# Patient Record
Sex: Female | Born: 2000 | Race: White | Hispanic: Yes | Marital: Single | Smoking: Never smoker
Health system: Southern US, Community
[De-identification: ages and names within clinical notes are randomized; demographics above are authoritative.]

## PROBLEM LIST (undated history)

## (undated) DIAGNOSIS — D649 Anemia, unspecified: Secondary | ICD-10-CM

## (undated) HISTORY — PX: APPENDECTOMY: SHX54

---
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
## 2021-06-14 ENCOUNTER — Emergency Department
Admission: EM | Admit: 2021-06-14 | Discharge: 2021-06-14 | Disposition: A | Discharge status: Home / Self Care | Payer: Medicaid Other | Attending: Emergency Medicine | Admitting: Emergency Medicine

## 2021-06-14 ENCOUNTER — Encounter: Payer: Self-pay | Admitting: Emergency Medicine

## 2021-06-14 ENCOUNTER — Other Ambulatory Visit: Payer: Self-pay

## 2021-06-14 DIAGNOSIS — U071 COVID-19: Secondary | ICD-10-CM | POA: Diagnosis not present

## 2021-06-14 DIAGNOSIS — J029 Acute pharyngitis, unspecified: Secondary | ICD-10-CM | POA: Diagnosis present

## 2021-06-14 LAB — RESP PANEL BY RT-PCR (FLU A&B, COVID) ARPGX2
Influenza A by PCR: NEGATIVE
Influenza B by PCR: NEGATIVE
SARS Coronavirus 2 by RT PCR: POSITIVE — AB

## 2021-06-14 MED ORDER — ACETAMINOPHEN 500 MG PO TABS
1000.0000 mg | ORAL_TABLET | Freq: Once | ORAL | Status: AC
  Administered 2021-06-14: 19:00:00 1000 mg via ORAL
  Filled 2021-06-14: qty 2

## 2021-06-14 MED ORDER — LIDOCAINE VISCOUS HCL 2 % MT SOLN: 15.0000 mL | OROMUCOSAL | 0 refills | Status: DC | PRN

## 2021-06-14 MED ORDER — ONDANSETRON 4 MG PO TBDP: 4.0000 mg | ORAL_TABLET | Freq: Three times a day (TID) | ORAL | 0 refills | Status: DC | PRN

## 2021-06-14 NOTE — Discharge Instructions (Signed)
Stay well hydrated. Take tylenol or ibuprofen for fever and pain. Follow up with primary care or return to the ER for symptoms of concern.

## 2021-06-14 NOTE — ED Provider Notes (Signed)
[facility] Emergency Department Provider Note  ____________________________________________  Time seen: Approximately 8:46 PM  I have reviewed the triage vital signs and the nursing notes.   HISTORY  Chief Complaint URI   HPI Elizabeth Lee is a 20 y.o. female presents to the ER for treatment of nasal congestion, body aches, sore throat and cough x 1 week.. She also has a poor appetite. No vomiting or diarrhea. No relief with rest.   History reviewed. No pertinent past medical history.  There are no problems to display for this patient.   History reviewed. No pertinent surgical history.  Prior to Admission medications   Medication Sig Start Date End Date Taking? Authorizing Provider  lidocaine (XYLOCAINE) 2 % solution Use as directed 15 mLs in the mouth or throat as needed for mouth pain. 06/14/21  Yes Aarush Stukey B, FNP  ondansetron (ZOFRAN-ODT) 4 MG disintegrating tablet Take 1 tablet (4 mg total) by mouth every 8 (eight) hours as needed for nausea or vomiting. 06/14/21  Yes Kemauri Musa B, FNP  cetirizine (ZYRTEC) 5 MG tablet Take 5 mg by mouth.    [provider]  ferrous sulfate 325 (65 FE) MG tablet Take 325 mg by mouth daily with breakfast.    [provider]  FLUoxetine (PROZAC) 10 MG capsule Take 10 mg by mouth daily.    [provider]  Norethindrone-Ethinyl Estradiol-Fe Biphas (LO LOESTRIN FE) 1 MG-10 MCG / 10 MCG tablet Take 1 tablet by mouth daily. 09/10/16   Gunnar Bulla, CNM  Polyethylene Glycol 3350 POWD Take by mouth. 06/06/16   [provider]    Allergies Amoxicillin and Penicillin g  Family History  Problem Relation Age of Onset   Diabetes Maternal Grandmother    Thyroid disease Maternal Grandmother     Social History Social History   Tobacco Use   Smoking status: Never   Smokeless tobacco: Never  Vaping Use   Vaping Use: Never used  Substance Use Topics   Alcohol  use: No   Drug use: No    Review of Systems Constitutional: Positive for fever/chills. Poor appetite. ENT: Positive for sore throat. Cardiovascular: Denies chest pain. Respiratory: No shortness of breath. negative for cough. Negative for wheezing.  Gastrointestinal: Positive for nausea,  no vomiting.  no diarrhea.  Musculoskeletal: Positive for  for body aches Skin: Negative for rash. Neurological: Positive for headaches ____________________________________________   PHYSICAL EXAM:  VITAL SIGNS: ED Triage Vitals  Enc Vitals Group     BP 06/14/21 1847 (!) 126/95     Pulse Rate 06/14/21 1847 98     Resp 06/14/21 1847 18     Temp 06/14/21 1847 (!) 100.7 F (38.2 C)     Temp Source 06/14/21 1847 Oral     SpO2 06/14/21 1847 99 %     Weight 06/14/21 1837 87 lb 15.4 oz (39.9 kg)     Height 06/14/21 1837 4\' 11"  (1.499 m)     Head Circumference --      Peak Flow --      Pain Score 06/14/21 1837 0     Pain Loc --      Pain Edu? --      Excl. in GC? --     Constitutional: Alert and oriented. Acutely ill appearing and in no acute distress. Eyes: Conjunctivae are normal. Ears: TM normal Nose: no sinus congestion noted; no rhinnorhea. Mouth/Throat: Mucous membranes are moist.  Oropharynx erythematous. Tonsils without exudate. Uvula midline.  Neck: No stridor.  Lymphatic: No cervical lymphadenopathy. Cardiovascular: Normal rate, regular rhythm. Good peripheral circulation. Respiratory: Respirations are even and unlabored.  No retractions. Breath sounds clear. Gastrointestinal: Soft and nontender.  Musculoskeletal: FROM x 4 extremities.  Neurologic:  Normal speech and language. Skin:  Skin is warm, dry and intact. No rash noted. Psychiatric: Mood and affect are normal. Speech and behavior are normal.  ____________________________________________   LABS (all labs ordered are listed, but only abnormal results are displayed)  Labs Reviewed  RESP PANEL BY RT-PCR (FLU A&B, COVID)  ARPGX2 - Abnormal; Notable for the following components:      Result Value   SARS Coronavirus 2 by RT PCR POSITIVE (*)    All other components within normal limits   ____________________________________________  EKG  Not indicated. ____________________________________________  RADIOLOGY  Not indicated ____________________________________________   PROCEDURES  Procedure(s) performed: None  Critical Care performed: No ____________________________________________   INITIAL IMPRESSION / ASSESSMENT AND PLAN / ED COURSE  20 y.o. female presents to the ER for evaluation of viral symptoms as described in the HPI.  Covid positive. Symptomatic treatment advised. She is to follow up with primary care or return to the ER if not improving over the week.     Medications  acetaminophen (TYLENOL) tablet 1,000 mg (1,000 mg Oral Given 06/14/21 1850)    ED Discharge Orders          Ordered    lidocaine (XYLOCAINE) 2 % solution  As needed        06/14/21 2051    ondansetron (ZOFRAN-ODT) 4 MG disintegrating tablet  Every 8 hours PRN        06/14/21 2051             Pertinent labs & imaging results that were available during my care of the patient were reviewed by me and considered in my medical decision making (see chart for details).    If controlled substance prescribed during this visit, 12 month history viewed on the [facility] prior to issuing an initial prescription for Schedule II or III opiod. ____________________________________________   FINAL CLINICAL IMPRESSION(S) / ED DIAGNOSES  Final diagnoses:  COVID    Note:  This document was prepared using Dragon voice recognition software and may include unintentional dictation errors.     Chinita Pester, FNP 06/14/21 2055    Merwyn Katos, MD 06/14/21 2118

## 2021-06-14 NOTE — ED Triage Notes (Signed)
C/O nasal congestion, body aches, sore throat, cough x 1 week.

## 2021-06-18 NOTE — L&D Delivery Note (Signed)
Delivery Note  First Stage: Labor onset: 1100 Augmentation : pitocin, AROM Analgesia /Anesthesia intrapartum: epidural AROM at 2003  Second Stage: Complete dilation at 0515 Onset of pushing at 0516 FHR second stage Category II, fetal tachycardia to the 180s, then back down to the 160s  Delivery of a viable female infant 05/06/2022 at 0740 by Donato Schultz, CNM. delivery of fetal head in OA position with restitution to LOA. No nuchal cord;  Anterior then posterior shoulders delivered easily with gentle downward traction. Baby placed on mom's chest, and attended to by peds.  Cord double clamped after cessation of pulsation, cut by FOB Cord blood sample collected   Third Stage: Placenta delivered Tomasa Blase intact with 3 VC @ [phone] Placenta disposition: discarded Uterine tone firm / bleeding scant  No laceration identified  Anesthesia for repair: n/a Repair n/a Est. Blood Loss (mL): 49ml  Complications: maternal low-grade fever in second stage with fetal tachycardia  Mom to postpartum.  Baby to Couplet care / Skin to Skin.  Newborn: Birth Weight: TBD, infant skin-to-skin  Apgar Scores: 9, 10 Feeding planned: breast and bottle

## 2021-09-19 ENCOUNTER — Emergency Department
Admission: EM | Admit: 2021-09-19 | Discharge: 2021-09-19 | Disposition: A | Discharge status: Home / Self Care | Payer: Medicaid Other | Attending: Emergency Medicine | Admitting: Emergency Medicine

## 2021-09-19 DIAGNOSIS — Z5321 Procedure and treatment not carried out due to patient leaving prior to being seen by health care provider: Secondary | ICD-10-CM | POA: Diagnosis not present

## 2021-09-19 DIAGNOSIS — O26891 Other specified pregnancy related conditions, first trimester: Secondary | ICD-10-CM | POA: Diagnosis present

## 2021-09-19 DIAGNOSIS — R1030 Lower abdominal pain, unspecified: Secondary | ICD-10-CM | POA: Insufficient documentation

## 2021-09-19 DIAGNOSIS — Z3A08 8 weeks gestation of pregnancy: Secondary | ICD-10-CM | POA: Diagnosis not present

## 2021-09-19 DIAGNOSIS — O219 Vomiting of pregnancy, unspecified: Secondary | ICD-10-CM | POA: Insufficient documentation

## 2021-09-19 LAB — COMPREHENSIVE METABOLIC PANEL
ALT: 12 U/L (ref 0–44)
AST: 19 U/L (ref 15–41)
Albumin: 4.5 g/dL (ref 3.5–5.0)
Alkaline Phosphatase: 47 U/L (ref 38–126)
Anion gap: 11 (ref 5–15)
BUN: 8 mg/dL (ref 6–20)
CO2: 23 mmol/L (ref 22–32)
Calcium: 9.2 mg/dL (ref 8.9–10.3)
Chloride: 101 mmol/L (ref 98–111)
Creatinine, Ser: 0.52 mg/dL (ref 0.44–1.00)
GFR, Estimated: >60 mL/min (ref >60)
Glucose, Bld: 101 mg/dL — ABNORMAL HIGH (ref 70–99)
Potassium: 3.8 mmol/L (ref 3.5–5.1)
Sodium: 135 mmol/L (ref 135–145)
Total Bilirubin: 0.5 mg/dL (ref 0.3–1.2)
Total Protein: 7.7 g/dL (ref 6.5–8.1)

## 2021-09-19 LAB — CBC
HCT: 34.6 % — ABNORMAL LOW (ref 36.0–46.0)
Hemoglobin: 10.9 g/dL — ABNORMAL LOW (ref 12.0–15.0)
MCH: 25.3 pg — ABNORMAL LOW (ref 26.0–34.0)
MCHC: 31.5 g/dL (ref 30.0–36.0)
MCV: 80.3 fL (ref 80.0–100.0)
Platelets: 283 10*3/uL (ref 150–400)
RBC: 4.31 MIL/uL (ref 3.87–5.11)
RDW: 14.9 % (ref 11.5–15.5)
WBC: 6.2 10*3/uL (ref 4.0–10.5)
nRBC: 0 % (ref 0.0–0.2)

## 2021-09-19 LAB — HCG, QUANTITATIVE, PREGNANCY: hCG, Beta Chain, Quant, S: 67006 m[IU]/mL — ABNORMAL HIGH (ref <5)

## 2021-09-19 LAB — LIPASE, BLOOD: Lipase: 28 U/L (ref 11–51)

## 2021-09-19 NOTE — ED Triage Notes (Signed)
Patient to ER via Pov with complaints of lower abdominal pain, describes it as cramping and none radiating. Also reports multiple episodes of emesis. Pale in appearance.  ? ?Reports being around [redacted] weeks pregnant, was seen last week by her OB. Prenatal care through [facility] health center.  ?

## 2021-10-21 LAB — OB RESULTS CONSOLE HEPATITIS B SURFACE ANTIGEN: Hepatitis B Surface Ag: NEGATIVE

## 2021-10-21 LAB — OB RESULTS CONSOLE ABO/RH

## 2021-10-21 LAB — OB RESULTS CONSOLE RUBELLA ANTIBODY, IGM: Rubella: IMMUNE

## 2021-10-21 LAB — OB RESULTS CONSOLE VARICELLA ZOSTER ANTIBODY, IGG: Varicella: IMMUNE

## 2021-12-21 ENCOUNTER — Observation Stay
Admission: EM | Admit: 2021-12-21 | Discharge: 2021-12-21 | Disposition: A | Discharge status: Home / Self Care | Payer: Medicaid Other | Attending: Obstetrics | Admitting: Obstetrics

## 2021-12-21 DIAGNOSIS — M545 Low back pain, unspecified: Secondary | ICD-10-CM | POA: Diagnosis not present

## 2021-12-21 DIAGNOSIS — R1030 Lower abdominal pain, unspecified: Secondary | ICD-10-CM | POA: Insufficient documentation

## 2021-12-21 DIAGNOSIS — O26893 Other specified pregnancy related conditions, third trimester: Principal | ICD-10-CM | POA: Insufficient documentation

## 2021-12-21 DIAGNOSIS — Z3A20 20 weeks gestation of pregnancy: Secondary | ICD-10-CM | POA: Diagnosis not present

## 2021-12-21 DIAGNOSIS — M549 Dorsalgia, unspecified: Secondary | ICD-10-CM | POA: Diagnosis present

## 2021-12-21 DIAGNOSIS — O99891 Other specified diseases and conditions complicating pregnancy: Secondary | ICD-10-CM | POA: Diagnosis present

## 2021-12-21 LAB — URINALYSIS, COMPLETE (UACMP) WITH MICROSCOPIC
Bilirubin Urine: NEGATIVE
Glucose, UA: NEGATIVE mg/dL
Hgb urine dipstick: NEGATIVE
Ketones, ur: NEGATIVE mg/dL
Leukocytes,Ua: NEGATIVE
Nitrite: NEGATIVE
Protein, ur: NEGATIVE mg/dL
Specific Gravity, Urine: 1.018 (ref 1.005–1.030)
pH: 5 (ref 5.0–8.0)

## 2021-12-21 NOTE — OB Triage Note (Signed)
Presents with complaint of lower back pain and lower abdominal cramping that started this morning around 5am. Denies bleeding or LOF. Pt states she had intercourse last night. Denies any urinary symptoms.

## 2021-12-21 NOTE — Discharge Summary (Addendum)
Elizabeth Lee is a 21 y.o. female. She is at [redacted]w[redacted]d gestation. No LMP recorded. Patient is pregnant. Estimated Date of Delivery: 05/08/22  [facility]  Chief complaint: lower abdomen and back pain  HPI: Elizabeth Lee presents to L&D with complaints of lower back and abdominal pain that started after intercourse last night  Factors complicating pregnancy: Normal pregnancy Hx of anxiety and depression  S: Resting comfortably. no CTX, no VB.no LOF,  Active fetal movement.   Maternal Medical History:  Past Medical Hx:  has no past medical history on file.    Past Surgical Hx:  has no past surgical history on file.   Allergies  Allergen Reactions   Amoxicillin Rash   Penicillin G Rash     Prior to Admission medications   Medication Sig Start Date End Date Taking? Authorizing Provider  cetirizine (ZYRTEC) 5 MG tablet Take 5 mg by mouth. Patient not taking: Reported on 12/21/2021    [provider]  ferrous sulfate 325 (65 FE) MG tablet Take 325 mg by mouth daily with breakfast.    [provider]  FLUoxetine (PROZAC) 10 MG capsule Take 10 mg by mouth daily. Patient not taking: Reported on 12/21/2021    [provider]  lidocaine (XYLOCAINE) 2 % solution Use as directed 15 mLs in the mouth or throat as needed for mouth pain. 06/14/21   Triplett, Rulon Eisenmenger B, FNP  Norethindrone-Ethinyl Estradiol-Fe Biphas (LO LOESTRIN FE) 1 MG-10 MCG / 10 MCG tablet Take 1 tablet by mouth daily. 09/10/16   Gunnar Bulla, CNM  ondansetron (ZOFRAN-ODT) 4 MG disintegrating tablet Take 1 tablet (4 mg total) by mouth every 8 (eight) hours as needed for nausea or vomiting. 06/14/21   Kem Boroughs B, FNP  Polyethylene Glycol 3350 POWD Take by mouth. 06/06/16   [provider]    Social History: She  reports that she has never smoked. She has never used smokeless tobacco. She reports that she does not drink alcohol and does not use drugs.  Family  History: family history includes Diabetes in her maternal grandmother; Thyroid disease in her maternal grandmother. ,no history of gyn cancers  Review of Systems: A full review of systems was performed and negative except as noted in the HPI.    O:  There were no vitals taken for this visit. Results for orders placed or performed during the hospital encounter of 12/21/21 (from the past 48 hour(s))  Urinalysis, Complete w Microscopic Urine, Clean Catch   Collection Time: 12/21/21  7:48 AM  Result Value Ref Range   Color, Urine YELLOW (A) YELLOW   APPearance HAZY (A) CLEAR   Specific Gravity, Urine 1.018 1.005 - 1.030   pH 5.0 5.0 - 8.0   Glucose, UA NEGATIVE NEGATIVE mg/dL   Hgb urine dipstick NEGATIVE NEGATIVE   Bilirubin Urine NEGATIVE NEGATIVE   Ketones, ur NEGATIVE NEGATIVE mg/dL   Protein, ur NEGATIVE NEGATIVE mg/dL   Nitrite NEGATIVE NEGATIVE   Leukocytes,Ua NEGATIVE NEGATIVE   RBC / HPF 0-5 0 - 5 RBC/hpf   WBC, UA 0-5 0 - 5 WBC/hpf   Bacteria, UA RARE (A) NONE SEEN   Squamous Epithelial / LPF 6-10 0 - 5   Mucus PRESENT      Constitutional: NAD, AAOx3  HE/ENT: extraocular movements grossly intact, moist mucous membranes CV: RRR PULM: nl respiratory effort Abd: gravid, non-tender, non-distended, soft  Ext: Non-tender, Nonedmeatous Psych: mood appropriate, speech normal Pelvic : deferred SVE:  Fetal Monitor: Doppler: 140 bpm  Toco: none    Assessment: 21 y.o. [redacted]w[redacted]d here for antenatal surveillance during pregnancy.  Principle diagnosis: Back pain affecting pregnancy [O99.891, M54.9]   Plan: Labor: not present.  Doppler-140 BPM Toco- Quiet UA- neg D/c home stable, precautions reviewed, follow-up as scheduled.   ----- Chari Manning CNM Certified Nurse Midwife [facility]

## 2022-02-03 LAB — OB RESULTS CONSOLE HIV ANTIBODY (ROUTINE TESTING): HIV: NONREACTIVE

## 2022-04-13 LAB — OB RESULTS CONSOLE GC/CHLAMYDIA
Chlamydia: NEGATIVE
Neisseria Gonorrhea: NEGATIVE

## 2022-04-13 LAB — OB RESULTS CONSOLE GBS: GBS: NEGATIVE

## 2022-04-28 ENCOUNTER — Encounter: Payer: Self-pay | Admitting: *Deleted

## 2022-04-28 ENCOUNTER — Observation Stay
Admission: EM | Admit: 2022-04-28 | Discharge: 2022-04-28 | Disposition: A | Discharge status: Home / Self Care | Payer: Medicaid Other | Attending: Obstetrics and Gynecology | Admitting: Obstetrics and Gynecology

## 2022-04-28 ENCOUNTER — Other Ambulatory Visit: Payer: Self-pay

## 2022-04-28 DIAGNOSIS — Z3A38 38 weeks gestation of pregnancy: Secondary | ICD-10-CM | POA: Diagnosis not present

## 2022-04-28 DIAGNOSIS — O471 False labor at or after 37 completed weeks of gestation: Principal | ICD-10-CM | POA: Insufficient documentation

## 2022-04-28 DIAGNOSIS — O479 False labor, unspecified: Secondary | ICD-10-CM | POA: Diagnosis present

## 2022-04-28 DIAGNOSIS — Z79899 Other long term (current) drug therapy: Secondary | ICD-10-CM | POA: Insufficient documentation

## 2022-04-28 HISTORY — DX: Anemia, unspecified: D64.9

## 2022-04-28 MED ORDER — CALCIUM CARBONATE ANTACID 500 MG PO CHEW: 2.0000 | CHEWABLE_TABLET | ORAL | Status: DC | PRN

## 2022-04-28 MED ORDER — ACETAMINOPHEN 500 MG PO TABS: 1000.0000 mg | ORAL_TABLET | Freq: Four times a day (QID) | ORAL | 0 refills | Status: AC | PRN

## 2022-04-28 MED ORDER — DOCUSATE SODIUM 100 MG PO CAPS: 100.0000 mg | ORAL_CAPSULE | Freq: Every day | ORAL | Status: DC

## 2022-04-28 MED ORDER — ZOLPIDEM TARTRATE 5 MG PO TABS: 5.0000 mg | ORAL_TABLET | Freq: Every evening | ORAL | Status: DC | PRN

## 2022-04-28 MED ORDER — PRENATAL MULTIVITAMIN CH: 1.0000 | ORAL_TABLET | Freq: Every day | ORAL | Status: DC

## 2022-04-28 MED ORDER — ACETAMINOPHEN 500 MG PO TABS: 1000.0000 mg | ORAL_TABLET | Freq: Four times a day (QID) | ORAL | Status: DC | PRN

## 2022-04-28 NOTE — Discharge Instructions (Signed)
Keep your next scheduled appointment.  If you have questions or concerns please call the on call provider.  If you have urgent concerns go to the nearest emergency department for evaluation.

## 2022-04-28 NOTE — OB Triage Note (Addendum)
Patient to OBS 4 with complaint of contractions that began on Friday around 6pm.  She rates her pain as 7/10 and describes the location as lower back and abdomen.  She reports baby moving well. Denies vaginal bleeding but reports increased mucus discharge. Cervix check and rechecked.  No change after 2 hours.

## 2022-04-28 NOTE — Discharge Summary (Signed)
Elizabeth Lee is a 21 y.o. female. She is at [redacted]w[redacted]d gestation. LMP 07/25/21 Estimated Date of Delivery: 05/08/22  [facility] care site[facility]  Chief complaint: uterine contractions  HPI: [name] presents to L&D with complaints of painful uterine contractions  Factors complicating pregnancy: Anemia   S: Resting comfortably. no VB.no LOF,  Active fetal movement.   Maternal Medical History:  Past Medical Hx:  has a past medical history of Anemia.    Past Surgical Hx:  has a past surgical history that includes Appendectomy.   Allergies  Allergen Reactions   Amoxicillin Rash   Penicillin G Rash     Prior to Admission medications   Medication Sig Start Date End Date Taking? Authorizing Provider  ferrous sulfate 325 (65 FE) MG tablet Take 325 mg by mouth daily with breakfast.   Yes [provider]  Prenatal Vit-Fe Fumarate-FA (PRENATAL MULTIVITAMIN) TABS tablet Take 1 tablet by mouth daily at 12 noon.   Yes [provider]  cetirizine (ZYRTEC) 5 MG tablet Take 5 mg by mouth. Patient not taking: Reported on 12/21/2021    [provider]  FLUoxetine (PROZAC) 10 MG capsule Take 10 mg by mouth daily. Patient not taking: Reported on 12/21/2021    [provider]  lidocaine (XYLOCAINE) 2 % solution Use as directed 15 mLs in the mouth or throat as needed for mouth pain. Patient not taking: Reported on 04/28/2022 06/14/21   Kem Boroughs B, FNP  Norethindrone-Ethinyl Estradiol-Fe Biphas (LO LOESTRIN FE) 1 MG-10 MCG / 10 MCG tablet Take 1 tablet by mouth daily. Patient not taking: Reported on 04/28/2022 09/10/16   Gunnar Bulla, CNM  ondansetron (ZOFRAN-ODT) 4 MG disintegrating tablet Take 1 tablet (4 mg total) by mouth every 8 (eight) hours as needed for nausea or vomiting. Patient not taking: Reported on 04/28/2022 06/14/21   Chinita Pester, FNP  Polyethylene Glycol 3350 POWD Take by mouth. Patient not taking: Reported on 04/28/2022 06/06/16    [provider]    Social History: She  reports that she has never smoked. She has never used smokeless tobacco. She reports that she does not drink alcohol and does not use drugs.  Family History: family history includes Diabetes in her maternal grandmother; Thyroid disease in her maternal grandmother. ,no history of gyn cancers  Review of Systems: A full review of systems was performed and negative except as noted in the HPI.    O:  BP 117/78   Pulse 96   Temp 100.1 F (37.8 C) (Oral)   Resp 16   SpO2 100%  No results found for this or any previous visit (from the past 48 hour(s)).   Constitutional: NAD, AAOx3  HE/ENT: extraocular movements grossly intact, moist mucous membranes CV: RRR PULM: nl respiratory effort, CTABL Abd: gravid, non-tender, non-distended, soft  Ext: Non-tender, Nonedmeatous Psych: mood appropriate, speech normal Pelvic : deferred SVE: Dilation: 1 Effacement (%): 50 Cervical Position: Posterior Station: -3 Presentation: Vertex Exam by:: J Braddy RN   Fetal Monitor: Baseline: 135 bpm Variability: moderate Accels: Present Decels: none Toco: irregular, every 1-7 minutes  Category: I   Assessment: 21 y.o. [redacted]w[redacted]d here for antenatal surveillance during pregnancy. Elizabeth Lee was evaluated for labor for 2 hours without cervical change.  Principle diagnosis:  There were no encounter diagnoses.   Plan: Labor: not present.  Fetal Wellbeing: Reassuring Cat 1 tracing. Reactive NST  D/c home stable, precautions reviewed, follow-up as scheduled.   ----- Chari Manning, CNM Certified Nurse  Midwife [facility]

## 2022-05-01 ENCOUNTER — Other Ambulatory Visit: Payer: Self-pay | Admitting: Certified Nurse Midwife

## 2022-05-01 DIAGNOSIS — Z3490 Encounter for supervision of normal pregnancy, unspecified, unspecified trimester: Secondary | ICD-10-CM

## 2022-05-01 NOTE — Progress Notes (Signed)
G1P0000 at [redacted]w[redacted]d, LMP of 07/25/2021, c/w early [place] at [redacted]w[redacted]d.  Scheduled for induction of labor for post-dates on 05/07/2022.   [facility]atal provider: [facility] Pregnancy complicated by: Anemia  Prenatal Labs: Blood type/Rh O pos  Antibody screen neg  Rubella Immune  Varicella Immune  RPR NR  HBsAg Neg  HIV NR  GC neg  Chlamydia neg  Genetic screening none  1 hour GTT 111  3 hour GTT N/a  GBS negative   Tdap: 02/16/2022 Flu: n/a Contraception: Nexplanon Feeding preference: breast  ____ Elizabeth Lee, CNM  Certified Nurse Midwife [facility]

## 2022-05-05 ENCOUNTER — Encounter: Payer: Self-pay | Admitting: Obstetrics and Gynecology

## 2022-05-05 ENCOUNTER — Inpatient Hospital Stay
Admission: EM | Admit: 2022-05-05 | Discharge: 2022-05-07 | DRG: 806 | Disposition: A | Discharge status: Home / Self Care | Payer: Medicaid Other | Attending: Certified Nurse Midwife | Admitting: Certified Nurse Midwife

## 2022-05-05 ENCOUNTER — Other Ambulatory Visit: Payer: Self-pay

## 2022-05-05 DIAGNOSIS — Z880 Allergy status to penicillin: Secondary | ICD-10-CM | POA: Diagnosis not present

## 2022-05-05 DIAGNOSIS — Z3A39 39 weeks gestation of pregnancy: Secondary | ICD-10-CM | POA: Diagnosis not present

## 2022-05-05 DIAGNOSIS — O26893 Other specified pregnancy related conditions, third trimester: Secondary | ICD-10-CM | POA: Diagnosis present

## 2022-05-05 DIAGNOSIS — O9081 Anemia of the puerperium: Secondary | ICD-10-CM | POA: Diagnosis not present

## 2022-05-05 DIAGNOSIS — O480 Post-term pregnancy: Principal | ICD-10-CM | POA: Diagnosis present

## 2022-05-05 DIAGNOSIS — D62 Acute posthemorrhagic anemia: Secondary | ICD-10-CM | POA: Diagnosis not present

## 2022-05-05 DIAGNOSIS — Z3490 Encounter for supervision of normal pregnancy, unspecified, unspecified trimester: Secondary | ICD-10-CM

## 2022-05-05 LAB — TYPE AND SCREEN
ABO/RH(D): O POS
Antibody Screen: NEGATIVE

## 2022-05-05 LAB — CBC
HCT: 35.7 % — ABNORMAL LOW (ref 36.0–46.0)
Hemoglobin: 11.9 g/dL — ABNORMAL LOW (ref 12.0–15.0)
MCH: 29 pg (ref 26.0–34.0)
MCHC: 33.3 g/dL (ref 30.0–36.0)
MCV: 87.1 fL (ref 80.0–100.0)
Platelets: 223 10*3/uL (ref 150–400)
RBC: 4.1 MIL/uL (ref 3.87–5.11)
RDW: 13.2 % (ref 11.5–15.5)
WBC: 10.3 10*3/uL (ref 4.0–10.5)
nRBC: 0 % (ref 0.0–0.2)

## 2022-05-05 LAB — ABO/RH: ABO/RH(D): O POS

## 2022-05-05 MED ORDER — LACTATED RINGERS IV SOLN: INTRAVENOUS | Status: DC

## 2022-05-05 MED ORDER — OXYTOCIN BOLUS FROM INFUSION
333.0000 mL | Freq: Once | INTRAVENOUS | Status: AC
  Administered 2022-05-06: 333 mL via INTRAVENOUS

## 2022-05-05 MED ORDER — OXYTOCIN 10 UNIT/ML IJ SOLN
INTRAMUSCULAR | Status: AC
  Filled 2022-05-05: qty 2

## 2022-05-05 MED ORDER — MISOPROSTOL 200 MCG PO TABS
ORAL_TABLET | ORAL | Status: AC
  Filled 2022-05-05: qty 4

## 2022-05-05 MED ORDER — ACETAMINOPHEN 325 MG PO TABS: 650.0000 mg | ORAL_TABLET | ORAL | Status: DC | PRN

## 2022-05-05 MED ORDER — MISOPROSTOL 25 MCG QUARTER TABLET: 25.0000 ug | ORAL_TABLET | Freq: Once | ORAL | Status: DC

## 2022-05-05 MED ORDER — LACTATED RINGERS IV SOLN: 500.0000 mL | INTRAVENOUS | Status: DC | PRN

## 2022-05-05 MED ORDER — SOD CITRATE-CITRIC ACID 500-334 MG/5ML PO SOLN: 30.0000 mL | ORAL | Status: DC | PRN

## 2022-05-05 MED ORDER — LIDOCAINE HCL (PF) 1 % IJ SOLN
INTRAMUSCULAR | Status: AC
  Filled 2022-05-05: qty 30

## 2022-05-05 MED ORDER — ONDANSETRON HCL 4 MG/2ML IJ SOLN: 4.0000 mg | Freq: Four times a day (QID) | INTRAMUSCULAR | Status: DC | PRN

## 2022-05-05 MED ORDER — LIDOCAINE HCL (PF) 1 % IJ SOLN: 30.0000 mL | INTRAMUSCULAR | Status: DC | PRN

## 2022-05-05 MED ORDER — MISOPROSTOL 25 MCG QUARTER TABLET: 25.0000 ug | ORAL_TABLET | ORAL | Status: DC | PRN

## 2022-05-05 MED ORDER — OXYTOCIN-SODIUM CHLORIDE 30-0.9 UT/500ML-% IV SOLN
1.0000 m[IU]/min | INTRAVENOUS | Status: DC
  Administered 2022-05-05 – 2022-05-06 (×2): 2 m[IU]/min via INTRAVENOUS
  Filled 2022-05-05: qty 500

## 2022-05-05 MED ORDER — AMMONIA AROMATIC IN INHA
RESPIRATORY_TRACT | Status: AC
  Filled 2022-05-05: qty 10

## 2022-05-05 MED ORDER — FENTANYL CITRATE (PF) 100 MCG/2ML IJ SOLN
50.0000 ug | INTRAMUSCULAR | Status: DC | PRN
  Administered 2022-05-05: 100 ug via INTRAVENOUS
  Filled 2022-05-05: qty 2

## 2022-05-05 MED ORDER — OXYTOCIN-SODIUM CHLORIDE 30-0.9 UT/500ML-% IV SOLN
2.5000 [IU]/h | INTRAVENOUS | Status: DC
  Administered 2022-05-06: 2.5 [IU]/h via INTRAVENOUS

## 2022-05-05 MED ORDER — TERBUTALINE SULFATE 1 MG/ML IJ SOLN: 0.2500 mg | Freq: Once | INTRAMUSCULAR | Status: DC | PRN

## 2022-05-05 NOTE — OB Triage Note (Addendum)
Pt states the ctx started around 10 or 11 today. She went for a walk and they "got worse." Denies vaginal bleeding, no LOF. Reports + fetal movement. Pt. Also rreports losing her mucous plug. Pt also reports her last OB visit was this past Thursday and she was 4 cm at that time. Elaina Hoops

## 2022-05-05 NOTE — Progress Notes (Addendum)
Labor Progress Note  Elizabeth Lee is a 21 y.o. G1P0000 at [redacted]w[redacted]d by LMP admitted for uterine contractions  Subjective: She feels pressure and has an urge to push  Objective: BP 106/62 (BP Location: Right Arm)   Pulse 75   Temp 98.4 F (36.9 C) (Oral)   Resp 15   Ht 4\' 11"  (1.499 m)   Wt 58.1 kg   BMI 25.85 kg/m  Notable VS details: reviewed  Fetal Assessment: FHT:  FHR: 125 bpm, variability: moderate,  accelerations:  Present,  decelerations:  Present intermittent variable deceleration Category/reactivity:  Category II UC:   regular, every 2-3 minutes SVE:    Dilation: 8.5cm  Effacement: 100%  Station:  0  Consistency: soft  Position: anterior  Membrane status:AROM @ 2002 Amniotic color: clear  Labs: Lab Results  Component Value Date   WBC 10.3 05/05/2022   HGB 11.9 (L) 05/05/2022   HCT 35.7 (L) 05/05/2022   MCV 87.1 05/05/2022   PLT 223 05/05/2022    Assessment / Plan: Spontaneous labor, progressing normally  Labor:  Progressing normally, pitocin currently at  1mu/min Preeclampsia:  labs stable Fetal Wellbeing:  Category II Pain Control:  Labor support without medications I/D:   GBS negative Anticipated MOD:  NSVD  3m, CNM 05/05/2022, 11:13 PM

## 2022-05-05 NOTE — Progress Notes (Signed)
Labor Progress Note  Elizabeth Lee is a 21 y.o. G1P0000 at [redacted]w[redacted]d by LMP admitted for uterine contractions  Subjective: she reports her contractions are only slightly more uncomfortable and she is having bloody show  Objective: BP 106/62 (BP Location: Right Arm)   Pulse 75   Temp 98.4 F (36.9 C) (Oral)   Resp 15   Ht 4\' 11"  (1.499 m)   Wt 58.1 kg   BMI 25.85 kg/m  Notable VS details: reviewed  Fetal Assessment: FHT:  FHR: 135 bpm, variability: moderate,  accelerations:  Present,  decelerations:  Absent Category/reactivity:  Category I UC:   regular, every 4 minutes SVE:    Dilation: 6cm  Effacement: 80%  Station:  -1  Consistency: soft  Position: middle  Membrane status:AROM @ 2002 Amniotic color: clear  Labs: Lab Results  Component Value Date   WBC 10.3 05/05/2022   HGB 11.9 (L) 05/05/2022   HCT 35.7 (L) 05/05/2022   MCV 87.1 05/05/2022   PLT 223 05/05/2022    Assessment / Plan: Spontaneous labor with slow progression  Labor:  Progressing slowly since arrival. AROM with clear fluid for augmentation. Preeclampsia:  labs stable Fetal Wellbeing:  Category I Pain Control:  Labor support without medications I/D:   GBS negative Anticipated MOD:  NSVD  05/07/2022, CNM 05/05/2022, 8:09 PM

## 2022-05-05 NOTE — H&P (Signed)
OB History & Physical   History of Present Illness:  Chief Complaint:   HPI:  Elizabeth Lee is a 21 y.o. G51P0000 female at [redacted]w[redacted]d dated by LMP.  She presents to L&D for painful uterine contractions.  Active FM onset of ctx @ 1100 currently every 4-5 minutes No LOF   Pregnancy Issues: 1. Anemia   Maternal Medical History:   Past Medical History:  Diagnosis Date   Anemia     Past Surgical History:  Procedure Laterality Date   APPENDECTOMY      Allergies  Allergen Reactions   Amoxicillin Rash   Penicillin G Rash    Prior to Admission medications   Medication Sig Start Date End Date Taking? Authorizing Provider  acetaminophen (TYLENOL) 500 MG tablet Take 2 tablets (1,000 mg total) by mouth every 6 (six) hours as needed (for pain scale < 4  OR  temperature  >/=  100.5 F). 04/28/22  Yes Sonny Dandy, CNM  ferrous sulfate 325 (65 FE) MG tablet Take 325 mg by mouth daily with breakfast.   Yes [provider]  Prenatal Vit-Fe Fumarate-FA (PRENATAL MULTIVITAMIN) TABS tablet Take 1 tablet by mouth daily at 12 noon.   Yes [provider]     [facility]ren[facility] care site: [facility]  Social History: She  reports that she has never smoked. She has never used smokeless tobacco. She reports that she does not drink alcohol and does not use drugs.  Family History: family history includes Diabetes in her maternal grandmother; Thyroid disease in her maternal grandmother.   Review of Systems: A full review of systems was performed and negative except as noted in the HPI.     Physical Exam:  Vital Signs: BP 122/71 (BP Location: Right Arm)   Pulse 89   Temp 98.2 F (36.8 C) (Oral)   Resp 16   Ht 4\' 11"  (1.499 m)   Wt 58.1 kg   BMI 25.85 kg/m  General: no acute distress.  HEENT: normocephalic, atraumatic Heart: regular rate & rhythm.  No murmurs/rubs/gallops Lungs: clear to auscultation bilaterally, normal respiratory effort Abdomen: soft, gravid,  non-tender;  EFW: 6.5lb Pelvic:   External: Normal external female genitalia  Cervix: Dilation: 5 / Effacement (%): 80 / Station: -1    Extremities: non-tender, symmetric, no edema bilaterally.  DTRs: +2  Neurologic: Alert & oriented x 3.    No results found for this or any previous visit (from the past 24 hour(s)).  Pertinent Results:  Prenatal Labs: Blood type/Rh O pos  Antibody screen neg  Rubella Immune  Varicella Immune  RPR NR  HBsAg Neg  HIV NR  GC neg  Chlamydia neg  Genetic screening negative  1 hour GTT 111  3 hour GTT N/a  GBS Negative   FHT: 130bpm, moderate variability, accelerations present, no decelerations TOCO: contractions q3-9min, moderate palpation SVE:  Dilation: 5 / Effacement (%): 80 / Station: -1    Cephalic by leopolds  No results found.  Assessment:  Elizabeth Lee is a 21 y.o. G78P0000 female at [redacted]w[redacted]d with uterine contractions.   Plan:  1. Admit to Labor & Delivery; consents reviewed and obtained - Dr. [redacted]w[redacted]d notified  2. Fetal Well being  - Fetal Tracing: Category I tracing - Group B Streptococcus ppx indicated: n/a, GBS negative - Presentation: vertex confirmed by SVE   3. Routine OB: - Prenatal labs reviewed, as above - Rh pos - CBC, T&S, RPR on admit - Clear fluids, saline lock  4. Monitoring of Labor -  Contractions q3-53min, external toco in place -  Plan for continuous fetal monitoring  -  Maternal pain control as desired; requesting IVPM - Anticipate vaginal delivery  5. Post Partum Planning: - Infant feeding: breast and bottle feeding - Contraception: Nexplanon - Tdap: 02/16/2022 - Flu: declined  Janyce Llanos, CNM 05/05/22 3:20 PM

## 2022-05-06 ENCOUNTER — Inpatient Hospital Stay: Payer: Medicaid Other | Admitting: Anesthesiology

## 2022-05-06 ENCOUNTER — Encounter: Payer: Self-pay | Admitting: Obstetrics and Gynecology

## 2022-05-06 LAB — RPR: RPR Ser Ql: NONREACTIVE

## 2022-05-06 MED ORDER — ONDANSETRON HCL 4 MG/2ML IJ SOLN: 4.0000 mg | INTRAMUSCULAR | Status: DC | PRN

## 2022-05-06 MED ORDER — IBUPROFEN 600 MG PO TABS
600.0000 mg | ORAL_TABLET | Freq: Four times a day (QID) | ORAL | Status: DC
  Administered 2022-05-06 – 2022-05-07 (×3): 600 mg via ORAL
  Filled 2022-05-06 (×4): qty 1

## 2022-05-06 MED ORDER — SENNOSIDES-DOCUSATE SODIUM 8.6-50 MG PO TABS
2.0000 | ORAL_TABLET | Freq: Every day | ORAL | Status: DC
  Administered 2022-05-07: 2 via ORAL
  Filled 2022-05-06: qty 2

## 2022-05-06 MED ORDER — EPHEDRINE 5 MG/ML INJ: 10.0000 mg | INTRAVENOUS | Status: DC | PRN

## 2022-05-06 MED ORDER — FENTANYL-BUPIVACAINE-NACL 0.5-0.125-0.9 MG/250ML-% EP SOLN
12.0000 mL/h | EPIDURAL | Status: DC | PRN
  Administered 2022-05-06: 12 mL/h via EPIDURAL

## 2022-05-06 MED ORDER — LIDOCAINE HCL (PF) 1 % IJ SOLN
INTRAMUSCULAR | Status: DC | PRN
  Administered 2022-05-06: 3 mL via SUBCUTANEOUS

## 2022-05-06 MED ORDER — PRENATAL MULTIVITAMIN CH
1.0000 | ORAL_TABLET | Freq: Every day | ORAL | Status: DC
  Administered 2022-05-06 – 2022-05-07 (×2): 1 via ORAL
  Filled 2022-05-06 (×2): qty 1

## 2022-05-06 MED ORDER — ACETAMINOPHEN 500 MG PO TABS
ORAL_TABLET | ORAL | Status: AC
  Filled 2022-05-06: qty 2

## 2022-05-06 MED ORDER — LIDOCAINE-EPINEPHRINE (PF) 1.5 %-1:200000 IJ SOLN
INTRAMUSCULAR | Status: DC | PRN
  Administered 2022-05-06: 4 mL via EPIDURAL

## 2022-05-06 MED ORDER — WITCH HAZEL-GLYCERIN EX PADS
1.0000 | MEDICATED_PAD | CUTANEOUS | Status: DC | PRN
  Administered 2022-05-06 (×2): 1 via TOPICAL
  Filled 2022-05-06 (×2): qty 100

## 2022-05-06 MED ORDER — PHENYLEPHRINE 80 MCG/ML (10ML) SYRINGE FOR IV PUSH (FOR BLOOD PRESSURE SUPPORT): 80.0000 ug | PREFILLED_SYRINGE | INTRAVENOUS | Status: DC | PRN

## 2022-05-06 MED ORDER — DIPHENHYDRAMINE HCL 25 MG PO CAPS: 25.0000 mg | ORAL_CAPSULE | Freq: Four times a day (QID) | ORAL | Status: DC | PRN

## 2022-05-06 MED ORDER — DIBUCAINE (PERIANAL) 1 % EX OINT
1.0000 | TOPICAL_OINTMENT | CUTANEOUS | Status: DC | PRN
  Administered 2022-05-06 (×2): 1 via RECTAL
  Filled 2022-05-06 (×2): qty 28

## 2022-05-06 MED ORDER — ACETAMINOPHEN 325 MG PO TABS
650.0000 mg | ORAL_TABLET | ORAL | Status: DC | PRN
  Administered 2022-05-06: 650 mg via ORAL
  Filled 2022-05-06 (×2): qty 2

## 2022-05-06 MED ORDER — SODIUM CHLORIDE 0.9 % IV SOLN
INTRAVENOUS | Status: DC | PRN
  Administered 2022-05-06: 5 mL via EPIDURAL
  Administered 2022-05-06: 3 mL via EPIDURAL

## 2022-05-06 MED ORDER — SIMETHICONE 80 MG PO CHEW: 80.0000 mg | CHEWABLE_TABLET | ORAL | Status: DC | PRN

## 2022-05-06 MED ORDER — BENZOCAINE-MENTHOL 20-0.5 % EX AERO
1.0000 | INHALATION_SPRAY | CUTANEOUS | Status: DC | PRN
  Administered 2022-05-06 (×2): 1 via TOPICAL
  Filled 2022-05-06 (×2): qty 56

## 2022-05-06 MED ORDER — ONDANSETRON HCL 4 MG PO TABS: 4.0000 mg | ORAL_TABLET | ORAL | Status: DC | PRN

## 2022-05-06 MED ORDER — DIPHENHYDRAMINE HCL 50 MG/ML IJ SOLN: 12.5000 mg | INTRAMUSCULAR | Status: DC | PRN

## 2022-05-06 MED ORDER — COCONUT OIL OIL: 1.0000 | TOPICAL_OIL | Status: DC | PRN

## 2022-05-06 MED ORDER — PHENYLEPHRINE 80 MCG/ML (10ML) SYRINGE FOR IV PUSH (FOR BLOOD PRESSURE SUPPORT)
80.0000 ug | PREFILLED_SYRINGE | INTRAVENOUS | Status: DC | PRN
  Administered 2022-05-06 (×2): 160 ug via INTRAVENOUS

## 2022-05-06 MED ORDER — ACETAMINOPHEN 500 MG PO TABS
1000.0000 mg | ORAL_TABLET | Freq: Four times a day (QID) | ORAL | Status: DC | PRN
  Administered 2022-05-06: 1000 mg via ORAL

## 2022-05-06 MED ORDER — ZOLPIDEM TARTRATE 5 MG PO TABS: 5.0000 mg | ORAL_TABLET | Freq: Every evening | ORAL | Status: DC | PRN

## 2022-05-06 MED ORDER — LACTATED RINGERS IV SOLN: 500.0000 mL | Freq: Once | INTRAVENOUS | Status: DC

## 2022-05-06 MED ORDER — FENTANYL-BUPIVACAINE-NACL 0.5-0.125-0.9 MG/250ML-% EP SOLN
EPIDURAL | Status: AC
  Filled 2022-05-06: qty 250

## 2022-05-06 NOTE — Lactation Note (Signed)
This note was copied from a baby's chart. Lactation Consultation Note  Patient Name: Elizabeth Lee [id number] Date: 05/06/2022 Reason for consult: Initial assessment,primapara  Age:20 hours  Maternal Data This is mom's first baby, with an SVD. Mom has an history of anemia.  Today was an initial assessment, which covered basic breastfeeding education with mom. Per mom, she primarily wants to breastfeed, but will use formula supplementation if she feels necessary. Mom reports that latching has been going well and she doesn't have any signs of discomfort. Mom reports that her breasts has been leaking as well.  Has patient been taught Hand Expression?: Yes Does the patient have breastfeeding experience prior to this delivery?: No  Feeding Mother's Current Feeding Choice: Breast Milk and Formula   Interventions Interventions: Breast feeding basics reviewed;Education  Reviewed with mom the wet/voids per day based upon infants age, feeding cues, hand expression, and cluster feeding around the 24 hour mark.  Consult Status Consult Status: Follow-up Date: 05/07/22 Follow-up type: In-patient  Updated care nurse following visit with patient.  [name] 05/06/2022, 3:04 PM

## 2022-05-06 NOTE — Discharge Summary (Signed)
Obstetrical Discharge Summary  Patient Name: Elizabeth Lee DOB: [birth year] MRN: [id number]  Date of Admission: 05/05/2022 Date of Delivery: 05/06/22 Delivered by: Elizabeth Lee, CNM  Date of Discharge: 05/07/2022  Primary OB: Elizabeth Lee LMP:No LMP recorded. EDC Estimated Date of Delivery: 05/08/22 Gestational Age at Delivery: [redacted]w[redacted]d   Antepartum complications:  Anemia  Admitting Diagnosis: Uterine contractions Secondary Diagnosis: NSVD Patient Active Problem List   Diagnosis Date Noted   NSVD (normal spontaneous vaginal delivery) 05/07/2022   Acute blood loss anemia 05/07/2022   Uterine contractions 04/28/2022   Back pain affecting pregnancy 12/21/2021   Back pain affecting pregnancy in second trimester 12/21/2021    Discharge Diagnosis: Term Pregnancy Delivered      Augmentation: AROM and Pitocin Complications: None Intrapartum complications/course: She arrived with painful contractions and was 4/60/-1. She slowly progressed and was augmented with AROM and pitocin. She reached 10/100/+2 and pushed for 2 hours, delivering viable female infant over intact perineum. Apgars 9, 10. Maternal low-grade fever (maximum temperature 100.15F) and fetal tachycardia, no maternal tachycardia. Chorio not present, no need for antibiotics.  Delivery Type: spontaneous vaginal delivery Anesthesia: epidural anesthesia Placenta: spontaneous To Pathology: No  Laceration: none Episiotomy: none Newborn Data: Live born female "[place]" Birth Weight:  3320g (7lb5.1oz) APGAR: 9, 10  Newborn Delivery   Birth date/time: 05/06/2022 07:40:00 Delivery type: Vaginal, Spontaneous      Postpartum Procedures: none Edinburgh:     05/06/2022    7:55 PM  Edinburgh Postnatal Depression Scale Screening Tool  I have been able to laugh and see the funny side of things. 0  I have looked forward with enjoyment to things. 0  I have blamed myself unnecessarily when things went wrong. 0  I have been  anxious or worried for no good reason. 2  I have felt scared or panicky for no good reason. 0  Things have been getting on top of me. 1  I have been so unhappy that I have had difficulty sleeping. 0  I have felt sad or miserable. 1  I have been so unhappy that I have been crying. 0  The thought of harming myself has occurred to me. 0  Edinburgh Postnatal Depression Scale Total 4     Post partum course:  Patient had an uncomplicated postpartum course.  By time of discharge on PPD#1, her pain was controlled on oral pain medications; she had appropriate lochia and was ambulating, voiding without difficulty and tolerating regular diet.  She was deemed stable for discharge to home.     Discharge Physical Exam:  BP 111/76 (BP Location: Left Arm)   Pulse 71   Temp 98.3 F (36.8 C) (Oral)   Resp 20   Ht 4\' 11"  (1.499 m)   Wt 58.1 kg   SpO2 98%   Breastfeeding Unknown   BMI 25.85 kg/m   General: NAD CV: RRR Pulm: nl effort ABD: s/nd/nt, fundus firm and below the umbilicus Lochia: moderate Perineum: minimal edema/intact DVT Evaluation: LE non-ttp, no evidence of DVT on exam.  Hemoglobin  Date Value Ref Range Status  05/07/2022 9.1 (L) 12.0 - 15.0 g/dL Final   HCT  Date Value Ref Range Status  05/07/2022 27.5 (L) 36.0 - 46.0 % Final    Risk assessment for postpartum VTE and prophylactic treatment: Very high risk factors: None High risk factors: None Moderate risk factors: None  Postpartum VTE prophylaxis with LMWH not indicated  Disposition: stable, discharge to home. Baby Feeding: breast and formula feeding Baby  Disposition: home with mom  Rh Immune globulin indicated: No Rubella vaccine given: was not indicated Varivax vaccine given: was not indicated Flu vaccine given in AP setting: No Tdap vaccine given in AP setting: Yes   Contraception:  Nexplanon  Prenatal Labs:  Blood type/Rh O pos  Antibody screen neg  Rubella Immune  Varicella Immune  RPR NR  HBsAg  Neg  HIV NR  GC neg  Chlamydia neg  Genetic screening negative  1 hour GTT 111  3 hour GTT N/a  GBS Negative    Plan:  Elizabeth Lee was discharged to home in good condition.   Discharge Medications: Allergies as of 05/07/2022       Reactions   Amoxicillin Rash   Penicillin G Rash        Medication List     TAKE these medications    acetaminophen 500 MG tablet Commonly known as: TYLENOL Take 2 tablets (1,000 mg total) by mouth every 6 (six) hours as needed (for pain scale < 4  OR  temperature  >/=  100.5 F).   ferrous sulfate 325 (65 FE) MG tablet Take 325 mg by mouth daily with breakfast.   ibuprofen 600 MG tablet Commonly known as: ADVIL Take 1 tablet (600 mg total) by mouth every 6 (six) hours as needed.   prenatal multivitamin Tabs tablet Take 1 tablet by mouth daily at 12 noon.         Follow-up Information     Elizabeth Lee, CNM Follow up in 6 week(s).   Specialty: Certified Nurse Midwife Why: 6wk postpartum, desires Nexplanon Contact information: [address] [place] [address] [phone]                 Signed: Cyril Lee, CNM 05/07/2022

## 2022-05-06 NOTE — Anesthesia Preprocedure Evaluation (Signed)
Anesthesia Evaluation  Patient identified by MRN, date of birth, ID band Patient awake    Reviewed: Allergy & Precautions, H&P , NPO status , Patient's Chart, lab work & pertinent test results, reviewed documented beta blocker date and time   Airway Mallampati: II  TM Distance: >3 FB Neck ROM: full    Dental no notable dental hx. (+) Teeth Intact   Pulmonary neg pulmonary ROS   Pulmonary exam normal breath sounds clear to auscultation       Cardiovascular Exercise Tolerance: Good negative cardio ROS  Rhythm:regular Rate:Normal     Neuro/Psych negative neurological ROS  negative psych ROS   GI/Hepatic negative GI ROS, Neg liver ROS,,,  Endo/Other  negative endocrine ROSdiabetes, Gestational    Renal/GU negative Renal ROS  negative genitourinary   Musculoskeletal   Abdominal   Peds  Hematology  (+) Blood dyscrasia, anemia   Anesthesia Other Findings   Reproductive/Obstetrics (+) Pregnancy                             Anesthesia Physical Anesthesia Plan  ASA: 2  Anesthesia Plan: Epidural   Post-op Pain Management:    Induction:   PONV Risk Score and Plan:   Airway Management Planned:   Additional Equipment:   Intra-op Plan:   Post-operative Plan:   Informed Consent: I have reviewed the patients History and Physical, chart, labs and discussed the procedure including the risks, benefits and alternatives for the proposed anesthesia with the patient or authorized representative who has indicated his/her understanding and acceptance.       Plan Discussed with:   Anesthesia Plan Comments:        Anesthesia Quick Evaluation

## 2022-05-06 NOTE — Anesthesia Procedure Notes (Signed)
Epidural Patient location during procedure: OB End time: 05/06/2022 12:33 AM  Staffing Anesthesiologist: Yevette Edwards, MD Performed: anesthesiologist   Preanesthetic Checklist Completed: patient identified, IV checked, site marked, risks and benefits discussed, surgical consent, monitors and equipment checked, pre-op evaluation and timeout performed  Epidural Patient position: sitting Prep: Betadine Patient monitoring: heart rate, continuous pulse ox and blood pressure Approach: midline Location: L4-L5 Injection technique: LOR saline  Needle:  Needle type: Tuohy  Needle gauge: 17 G Needle length: 9 cm and 9 Needle insertion depth: 5 cm Catheter type: closed end flexible Catheter size: 19 Gauge Catheter at skin depth: 11 cm Test dose: negative and 1.5% lidocaine with Epi 1:200 K  Assessment Events: blood not aspirated, injection not painful, no injection resistance, no paresthesia and negative IV test  Additional Notes   Patient tolerated the insertion well without complications.Reason for block:procedure for pain

## 2022-05-06 NOTE — Progress Notes (Addendum)
Labor Progress Note  Elizabeth Lee is a 21 y.o. G1P0000 at [redacted]w[redacted]d by LMP admitted for uterine contractions  Subjective: she is comfortable after her epidural and now feels rectal pressure  Objective: BP (!) 112/56 (BP Location: Left Arm)   Pulse 90   Temp 99.3 F (37.4 C) (Oral)   Resp 14   Ht 4\' 11"  (1.499 m)   Wt 58.1 kg   SpO2 97%   BMI 25.85 kg/m  Notable VS details: reviewed  Fetal Assessment: FHT:  FHR: 135 bpm, variability: moderate,  accelerations:  Present,  decelerations:  Present recurrent early decelerations Category/reactivity:  Category I UC:   regular, every 2-3 minutes SVE:    Dilation: 10cm  Effacement: 100%  Station:  +2  Consistency: soft  Position: anterior  Membrane status:AROM @ 2002 Amniotic color: clear  Labs: Lab Results  Component Value Date   WBC 10.3 05/05/2022   HGB 11.9 (L) 05/05/2022   HCT 35.7 (L) 05/05/2022   MCV 87.1 05/05/2022   PLT 223 05/05/2022    Assessment / Plan: Spontaneous labor, progressing normally  Labor:  Progressing normally, pitocin currently at 55mu/min. Feeling pressure and 10/100/+2, begin pushing. Preeclampsia:  labs stable Fetal Wellbeing:  Category I Pain Control:  Epidural I/D:   GBS negative, AROM x 9 hours Anticipated MOD:  NSVD  11m, CNM 05/06/2022, 5:31 AM

## 2022-05-06 NOTE — Anesthesia Postprocedure Evaluation (Signed)
Anesthesia Post Note  Patient: Elizabeth Lee  Procedure(s) Performed: AN AD HOC LABOR EPIDURAL  Patient location during evaluation: Mother Baby Anesthesia Type: Epidural Level of consciousness: awake and alert Pain management: pain level controlled Vital Signs Assessment: post-procedure vital signs reviewed and stable Respiratory status: spontaneous breathing, nonlabored ventilation and respiratory function stable Cardiovascular status: stable Postop Assessment: no headache, no backache and epidural receding Anesthetic complications: no   No notable events documented.   Last Vitals:  Vitals:   05/06/22 0937 05/06/22 1005  BP: 137/75 127/76  Pulse: 60 64  Resp:    Temp:    SpO2:      Last Pain:  Vitals:   05/06/22 0937  TempSrc:   PainSc: 2                  Yevette Edwards

## 2022-05-07 DIAGNOSIS — D62 Acute posthemorrhagic anemia: Secondary | ICD-10-CM | POA: Diagnosis not present

## 2022-05-07 LAB — CBC
HCT: 27.5 % — ABNORMAL LOW (ref 36.0–46.0)
Hemoglobin: 9.1 g/dL — ABNORMAL LOW (ref 12.0–15.0)
MCH: 29.1 pg (ref 26.0–34.0)
MCHC: 33.1 g/dL (ref 30.0–36.0)
MCV: 87.9 fL (ref 80.0–100.0)
Platelets: 185 10*3/uL (ref 150–400)
RBC: 3.13 MIL/uL — ABNORMAL LOW (ref 3.87–5.11)
RDW: 13.4 % (ref 11.5–15.5)
WBC: 13.7 10*3/uL — ABNORMAL HIGH (ref 4.0–10.5)
nRBC: 0.1 % (ref 0.0–0.2)

## 2022-05-07 MED ORDER — IBUPROFEN 600 MG PO TABS: 600.0000 mg | ORAL_TABLET | Freq: Four times a day (QID) | ORAL | Status: AC | PRN

## 2022-05-07 NOTE — Discharge Summary (Signed)
RN to remove peripheral IV. Went over discharge instructions with patient. Gave patient opportunity for questions. All questions answered at this time. Pt verbalized understanding of teaching. Pt discharged home with her husband and baby via wheelchair

## 2022-05-07 NOTE — Progress Notes (Signed)
Post Partum Day 1  Subjective: Doing well, no concerns. Ambulating without difficulty, pain managed with PO meds, tolerating regular diet, and voiding without difficulty.   No fever/chills, chest pain, shortness of breath, nausea/vomiting, or leg pain. No nipple or breast pain. No headache, visual changes, or RUQ/epigastric pain.  Objective: BP (!) 97/55 (BP Location: Left Arm)   Pulse 86   Temp 98.6 F (37 C) (Oral)   Resp 18   Ht 4\' 11"  (1.499 m)   Wt 58.1 kg   SpO2 100%   Breastfeeding Unknown   BMI 25.85 kg/m    Physical Exam:  General: alert and cooperative Breasts: soft/nontender CV: RRR Pulm: nl effort Abdomen: soft, non-tender Uterine Fundus: firm Incision: n/a Perineum: minimal edema, intact Lochia: appropriate DVT Evaluation: No evidence of DVT seen on physical exam. Edinburgh:     05/06/2022    7:55 PM  05/08/2022 Postnatal Depression Scale Screening Tool  I have been able to laugh and see the funny side of things. 0  I have looked forward with enjoyment to things. 0  I have blamed myself unnecessarily when things went wrong. 0  I have been anxious or worried for no good reason. 2  I have felt scared or panicky for no good reason. 0  Things have been getting on top of me. 1  I have been so unhappy that I have had difficulty sleeping. 0  I have felt sad or miserable. 1  I have been so unhappy that I have been crying. 0  The thought of harming myself has occurred to me. 0  Edinburgh Postnatal Depression Scale Total 4     Recent Labs    05/05/22 1555 05/07/22 0618  HGB 11.9* 9.1*  HCT 35.7* 27.5*  WBC 10.3 13.7*  PLT 223 185    Assessment/Plan: 21 y.o. G1P1001 postpartum day # 1  -Continue routine postpartum care -Lactation consult PRN for breastfeeding   -Acute blood loss anemia - hemodynamically stable and asymptomatic; start PO ferrous sulfate BID with stool softeners  -Immunization status: Needs flu prior to discharge - though she declined  in the AP setting  Disposition: Continue inpatient postpartum care   LOS: 2 days   Francisca Harbuck, CNM 05/07/2022, 8:25 AM

## 2022-12-16 ENCOUNTER — Other Ambulatory Visit: Payer: Self-pay

## 2022-12-16 ENCOUNTER — Emergency Department
Admission: EM | Admit: 2022-12-16 | Discharge: 2022-12-16 | Disposition: A | Discharge status: Home / Self Care | Payer: Medicaid Other | Attending: Emergency Medicine | Admitting: Emergency Medicine

## 2022-12-16 ENCOUNTER — Encounter: Payer: Self-pay | Admitting: Emergency Medicine

## 2022-12-16 DIAGNOSIS — R21 Rash and other nonspecific skin eruption: Secondary | ICD-10-CM | POA: Diagnosis present

## 2022-12-16 MED ORDER — HYDROCORTISONE 1 % EX CREA: 1.0000 | TOPICAL_CREAM | Freq: Four times a day (QID) | CUTANEOUS | 0 refills | Status: AC

## 2022-12-16 MED ORDER — DIPHENHYDRAMINE HCL 25 MG PO CAPS
25.0000 mg | ORAL_CAPSULE | Freq: Once | ORAL | Status: AC
  Administered 2022-12-16: 25 mg via ORAL
  Filled 2022-12-16: qty 1

## 2022-12-16 MED ORDER — DIPHENHYDRAMINE HCL 50 MG PO TABS: 50.0000 mg | ORAL_TABLET | Freq: Every evening | ORAL | 0 refills | Status: AC | PRN

## 2022-12-16 NOTE — ED Notes (Signed)
Patient states she has someone coming to pick her up.

## 2022-12-16 NOTE — ED Provider Notes (Signed)
[facility] Emergency Department Provider Note     Event Date/Time   First MD Initiated Contact with Patient 12/16/22 1121     (approximate)   History   Rash   HPI  Elizabeth Lee is a 22 y.o. female who presents to the ED for complaint of a rash x 2 days.  Patient reports she thinks something bit her on her left thigh while being at home.  Patient reports area is painful to touch.  Patient reports generalized itching all over.  Patient denies taking anything for symptoms. No other complaints at this time.  Denies fever, chest pain, and shortness of breath.    Physical Exam   Triage Vital Signs: ED Triage Vitals  Enc Vitals Group     BP 12/16/22 1116 121/84     Pulse Rate 12/16/22 1116 88     Resp 12/16/22 1116 14     Temp 12/16/22 1116 99.4 F (37.4 C)     Temp Source 12/16/22 1116 Axillary     SpO2 12/16/22 1116 100 %     Weight 12/16/22 1118 102 lb (46.3 kg)     Height 12/16/22 1118 4\' 9"  (1.448 m)     Head Circumference --      Peak Flow --      Pain Score 12/16/22 1117 8     Pain Loc --      Pain Edu? --      Excl. in GC? --     Most recent vital signs: Vitals:   12/16/22 1116  BP: 121/84  Pulse: 88  Resp: 14  Temp: 99.4 F (37.4 C)  SpO2: 100%    General Awake, no distress.  HEENT NCAT. PERRL. EOMI. No rhinorrhea. Mucous membranes are moist.  CV:  Good peripheral perfusion.  RESP:  Normal effort.  ABD:  No distention.  Other:  Left lateral thigh reveals small, round, erythremic, mildly raised papule with a single pinpoint scab centrally. No surrounding edema noted. TTP.   ED Results / Procedures / Treatments   Labs (all labs ordered are listed, but only abnormal results are displayed) Labs Reviewed - No data to display  No results found.  PROCEDURES:  Critical Care performed: No  Procedures  MEDICATIONS ORDERED IN ED: Medications  diphenhydrAMINE (BENADRYL) capsule 25 mg (25 mg Oral Given 12/16/22 1158)    IMPRESSION / MDM / ASSESSMENT AND PLAN / ED COURSE  I reviewed the triage vital signs and the nursing notes.                               22 y.o. female presents to the emergency department for evaluation and treatment of a rash. See HPI for further details.   Differential diagnosis includes, but is not limited to insect bite, contact dermatitis, allergic dermatitis, tick bite, allergic reaction, urticaria.  On physical exam there is a small red inflamed bump on the left lateral mid thigh.  Patient reports it sometimes painful sometimes itchy.  Unknown source.  Highly suspicious for insect bite possible spider bite given the appearance.  No further workup indicated at this time. She will be administered Benadryl prior to discharge.  We discussed sedative side effects of this medication.  She will be picked up by her boyfriend from the ED.  Patient will be discharged home with prescriptions for Benadryl and hydrocortisone cream for symptom relief.  Patient is educated on at  home management including applying cool compresses, avoiding scratching or rubbing the area and exposing the area to air.  She is in stable condition for discharge home. Patient is given ED precautions to return to the ED for any worsening or new symptoms. Patient verbalizes understanding. All questions and concerns were addressed during ED visit.    Patient's presentation is most consistent with acute, uncomplicated illness.  FINAL CLINICAL IMPRESSION(S) / ED DIAGNOSES   Final diagnoses:  Rash     Rx / DC Orders   ED Discharge Orders          Ordered    diphenhydrAMINE (BENADRYL) 50 MG tablet  At bedtime PRN        12/16/22 1149    hydrocortisone cream 1 %  4 times daily        12/16/22 1149             Note:  This document was prepared using Dragon voice recognition software and may include unintentional dictation errors.    Romeo Apple, Ariyan Sinnett A, PA-C 12/16/22 1752    Trinna Post, MD 12/16/22 [phone]

## 2022-12-16 NOTE — ED Triage Notes (Signed)
Pt states that 2 days ago she was bit on her left leg by an unknown insect and since then she has developed itching and a rash. Pt denies taking any medications since. Pt states pain increases when walking. Pt states she has also been feeding a neighbors cat and thinks that could be a cause of her itching as well.

## 2022-12-16 NOTE — Discharge Instructions (Addendum)
Pick up calamine lotion from local pharmacy or nearest [facility], [employer] etc.   Apply cool, wet cloths (compresses) to the affected areas. Do not scratch or rub your skin. Avoid covering the rash. Keep it exposed to air as often as you can.

## 2023-06-21 ENCOUNTER — Emergency Department: Payer: Medicaid Other

## 2023-06-21 ENCOUNTER — Other Ambulatory Visit: Payer: Self-pay

## 2023-06-21 ENCOUNTER — Emergency Department
Admission: EM | Admit: 2023-06-21 | Discharge: 2023-06-21 | Disposition: A | Discharge status: Home / Self Care | Payer: Medicaid Other | Attending: Emergency Medicine | Admitting: Emergency Medicine

## 2023-06-21 DIAGNOSIS — R1013 Epigastric pain: Secondary | ICD-10-CM | POA: Diagnosis present

## 2023-06-21 DIAGNOSIS — K2900 Acute gastritis without bleeding: Secondary | ICD-10-CM | POA: Diagnosis not present

## 2023-06-21 LAB — COMPREHENSIVE METABOLIC PANEL
ALT: 11 U/L (ref 0–44)
AST: 19 U/L (ref 15–41)
Albumin: 4.4 g/dL (ref 3.5–5.0)
Alkaline Phosphatase: 57 U/L (ref 38–126)
Anion gap: 13 (ref 5–15)
BUN: 17 mg/dL (ref 6–20)
CO2: 20 mmol/L — ABNORMAL LOW (ref 22–32)
Calcium: 8.7 mg/dL — ABNORMAL LOW (ref 8.9–10.3)
Chloride: 102 mmol/L (ref 98–111)
Creatinine, Ser: 0.62 mg/dL (ref 0.44–1.00)
GFR, Estimated: >60 mL/min (ref >60)
Glucose, Bld: 111 mg/dL — ABNORMAL HIGH (ref 70–99)
Potassium: 3.5 mmol/L (ref 3.5–5.1)
Sodium: 135 mmol/L (ref 135–145)
Total Bilirubin: 0.7 mg/dL (ref 0.0–1.2)
Total Protein: 7.2 g/dL (ref 6.5–8.1)

## 2023-06-21 LAB — URINALYSIS, ROUTINE W REFLEX MICROSCOPIC
Bacteria, UA: NONE SEEN
Bilirubin Urine: NEGATIVE
Glucose, UA: NEGATIVE mg/dL
Hgb urine dipstick: NEGATIVE
Ketones, ur: NEGATIVE mg/dL
Nitrite: NEGATIVE
Protein, ur: 30 mg/dL — AB
Specific Gravity, Urine: 1.027 (ref 1.005–1.030)
pH: 8 (ref 5.0–8.0)

## 2023-06-21 LAB — CBC
HCT: 37.2 % (ref 36.0–46.0)
Hemoglobin: 11.8 g/dL — ABNORMAL LOW (ref 12.0–15.0)
MCH: 28.4 pg (ref 26.0–34.0)
MCHC: 31.7 g/dL (ref 30.0–36.0)
MCV: 89.4 fL (ref 80.0–100.0)
Platelets: 264 10*3/uL (ref 150–400)
RBC: 4.16 MIL/uL (ref 3.87–5.11)
RDW: 13.5 % (ref 11.5–15.5)
WBC: 7.5 10*3/uL (ref 4.0–10.5)
nRBC: 0 % (ref 0.0–0.2)

## 2023-06-21 LAB — LIPASE, BLOOD: Lipase: 24 U/L (ref 11–51)

## 2023-06-21 LAB — POC URINE PREG, ED: Preg Test, Ur: NEGATIVE

## 2023-06-21 MED ORDER — FAMOTIDINE 20 MG PO TABS: 20.0000 mg | ORAL_TABLET | Freq: Every day | ORAL | 1 refills | Status: AC

## 2023-06-21 NOTE — ED Triage Notes (Signed)
 Intermittent lower abd pain and L flank pain x 3 days. Reports when pain comes on she gets shaky and hurts so bad she can't walk. Denies painful urination but reports increased frequency. Pt ambulatory to triage. Alert and oriented. Breathing unlabored speaking in full sentences with symmetric chest rise and fall.

## 2023-06-21 NOTE — Discharge Instructions (Addendum)
 Try the famotidine (Pepcid) medication once daily every day. Can take TUMS in addition to this, as needed.   Reach out to the GI doctor to see them in the clinic  If your symptoms worsen, please return to the ED.

## 2023-06-21 NOTE — ED Provider Notes (Signed)
 [facility] Provider Note    Event Date/Time   First MD Initiated Contact with Patient 06/21/23 0501     (approximate)   History   Abdominal Pain and Flank Pain (/)   HPI  Elizabeth Lee is a 23 y.o. female who presents to the ED for evaluation of Abdominal Pain and Flank Pain (/)   Patient presents for evaluation of about 1 year of intermittent epigastric and LUQ abdominal discomfort that is sharp.  Reports sometimes postprandial.  Denies any emesis, fevers or lower abdominal discomfort.  Reports this has been going on since she gave birth to her child but has been more severe in the past 3 days.   Physical Exam   Triage Vital Signs: ED Triage Vitals [06/21/23 0130]  Encounter Vitals Group     BP 122/74     Systolic BP Percentile      Diastolic BP Percentile      Pulse Rate 86     Resp 18     Temp 98.2 F (36.8 C)     Temp Source Oral     SpO2 100 %     Weight 97 lb (44 kg)     Height 4' 9 (1.448 m)     Head Circumference      Peak Flow      Pain Score 9     Pain Loc      Pain Education      Exclude from Growth Chart     Most recent vital signs: Vitals:   06/21/23 0130 06/21/23 0621  BP: 122/74 103/72  Pulse: 86 (!) 58  Resp: 18 16  Temp: 98.2 F (36.8 C) 98.5 F (36.9 C)  SpO2: 100% 100%    General: Awake, no distress.  CV:  Good peripheral perfusion.  Resp:  Normal effort.  Abd:  No distention.  Mild epigastric and LUQ tenderness MSK:  No deformity noted.  Neuro:  No focal deficits appreciated. Other:     ED Results / Procedures / Treatments   Labs (all labs ordered are listed, but only abnormal results are displayed) Labs Reviewed  COMPREHENSIVE METABOLIC PANEL - Abnormal; Notable for the following components:      Result Value   CO2 20 (*)    Glucose, Bld 111 (*)    Calcium  8.7 (*)    All other components within normal limits  CBC - Abnormal; Notable for the following components:   Hemoglobin 11.8 (*)    All  other components within normal limits  URINALYSIS, ROUTINE W REFLEX MICROSCOPIC - Abnormal; Notable for the following components:   Color, Urine YELLOW (*)    APPearance HAZY (*)    Protein, ur 30 (*)    Leukocytes,Ua SMALL (*)    All other components within normal limits  LIPASE, BLOOD  POC URINE PREG, ED    EKG   RADIOLOGY CT renal study interpreted by me without signs of acute pathology.  Official radiology report(s): CT Renal Stone Study Result Date: 06/21/2023 CLINICAL DATA:  Abdominal/flank pain, stone suspected EXAM: CT ABDOMEN AND PELVIS WITHOUT CONTRAST TECHNIQUE: Multidetector CT imaging of the abdomen and pelvis was performed following the standard protocol without IV contrast. RADIATION DOSE REDUCTION: This exam was performed according to the departmental dose-optimization program which includes automated exposure control, adjustment of the mA and/or kV according to patient size and/or use of iterative reconstruction technique. COMPARISON:  None Available. FINDINGS: Lower chest: No acute abnormality. Hepatobiliary: No focal  liver abnormality. No gallstones, gallbladder wall thickening, or pericholecystic fluid. No biliary dilatation. Pancreas: No focal lesion. Normal pancreatic contour. No surrounding inflammatory changes. No main pancreatic ductal dilatation. Spleen: Normal in size without focal abnormality. Adrenals/Urinary Tract: No adrenal nodule bilaterally. No nephrolithiasis and no hydronephrosis. No definite contour-deforming renal mass. No ureterolithiasis or hydroureter. The urinary bladder is unremarkable. Stomach/Bowel: Stomach is within normal limits. No evidence of bowel wall thickening or dilatation. Status post appendectomy. Vascular/Lymphatic: No abdominal aorta or iliac aneurysm. No abdominal, pelvic, or inguinal lymphadenopathy. Reproductive: Uterus and bilateral adnexa are unremarkable. Other: No intraperitoneal free fluid. No intraperitoneal free gas. No organized  fluid collection. Musculoskeletal: No abdominal wall hernia or abnormality. No suspicious lytic or blastic osseous lesions. No acute displaced fracture. IMPRESSION: No acute intraabdominal or intrapelvic abnormality with limited evaluation on this noncontrast study. Electronically Signed   By: Morgane  Naveau M.D.   On: 06/21/2023 02:31    PROCEDURES and INTERVENTIONS:  Procedures  Medications - No data to display   IMPRESSION / MDM / ASSESSMENT AND PLAN / ED COURSE  I reviewed the triage vital signs and the nursing notes.  Differential diagnosis includes, but is not limited to, biliary colic, ureteral colic, gastritis or GERD  {Patient presents with symptoms of an acute illness or injury that is potentially life-threatening.  Patient presents with evidence of likely gastritis suitable for outpatient management with GI follow-up and initiation of an H2 blocker.  Looks well with some mild localized tenderness but no generalized symptoms or peritoneal features.  Normal CBC, lipase and metabolic panel.  Urine without infectious features.  CT, as above.  Will start her on H2 blocker and refer to GI.      FINAL CLINICAL IMPRESSION(S) / ED DIAGNOSES   Final diagnoses:  Acute gastritis without hemorrhage, unspecified gastritis type     Rx / DC Orders   ED Discharge Orders          Ordered    famotidine  (PEPCID ) 20 MG tablet  Daily        06/21/23 0531             Note:  This document was prepared using Dragon voice recognition software and may include unintentional dictation errors.   Claudene Rover, MD 06/21/23 [phone]

## 2023-12-29 ENCOUNTER — Emergency Department
Admission: EM | Admit: 2023-12-29 | Discharge: 2023-12-29 | Disposition: A | Discharge status: Home / Self Care | Attending: Emergency Medicine | Admitting: Emergency Medicine

## 2023-12-29 ENCOUNTER — Other Ambulatory Visit: Payer: Self-pay

## 2023-12-29 DIAGNOSIS — S61411A Laceration without foreign body of right hand, initial encounter: Secondary | ICD-10-CM | POA: Insufficient documentation

## 2023-12-29 DIAGNOSIS — W260XXA Contact with knife, initial encounter: Secondary | ICD-10-CM | POA: Diagnosis not present

## 2023-12-29 DIAGNOSIS — Y93G1 Activity, food preparation and clean up: Secondary | ICD-10-CM | POA: Diagnosis not present

## 2023-12-29 NOTE — ED Provider Notes (Signed)
 [facility] Provider Note    Event Date/Time   First MD Initiated Contact with Patient 12/29/23 1948     (approximate)   History   Laceration   HPI  Elizabeth Lee is a 23 y.o. female with no PMH presents for evaluation of a laceration to the right arm.  Patient was cutting of an avocado when the knife slipped cutting her hand.  Patient is up-to-date on her tetanus shot.      Physical Exam   Triage Vital Signs: ED Triage Vitals  Encounter Vitals Group     BP 12/29/23 1748 127/75     Girls Systolic BP Percentile --      Girls Diastolic BP Percentile --      Boys Systolic BP Percentile --      Boys Diastolic BP Percentile --      Pulse Rate 12/29/23 1748 (!) 102     Resp 12/29/23 1748 15     Temp 12/29/23 1748 98.4 F (36.9 C)     Temp Source 12/29/23 1748 Oral     SpO2 12/29/23 1748 100 %     Weight --      Height 12/29/23 1751 4' 9 (1.448 m)     Head Circumference --      Peak Flow --      Pain Score 12/29/23 1751 5     Pain Loc --      Pain Education --      Exclude from Growth Chart --     Most recent vital signs: Vitals:   12/29/23 1748  BP: 127/75  Pulse: (!) 102  Resp: 15  Temp: 98.4 F (36.9 C)  SpO2: 100%   General: Awake, no distress.  CV:  Good peripheral perfusion.  Resp:  Normal effort.  Abd:  No distention.  Other:  About a 1 cm superficial laceration to the right palm with no active bleeding.  Hand range of motion intact.  Neurovascularly intact.   ED Results / Procedures / Treatments   Labs (all labs ordered are listed, but only abnormal results are displayed) Labs Reviewed - No data to display   PROCEDURES:  Critical Care performed: No  .Laceration Repair  Date/Time: 12/29/2023 9:34 PM  Performed by: Cleaster Tinnie LABOR, PA-C Authorized by: Cleaster Tinnie LABOR, PA-C   Consent:    Consent obtained:  Verbal   Consent given by:  Patient   Risks, benefits, and alternatives were discussed: yes      Risks discussed:  Infection and pain   Alternatives discussed:  No treatment Universal protocol:    Patient identity confirmed:  Verbally with patient Anesthesia:    Anesthesia method:  None Laceration details:    Location:  Hand   Hand location:  R palm   Length (cm):  1   Depth (mm):  1 Treatment:    Area cleansed with:  Saline   Amount of cleaning:  Standard   Irrigation solution:  Sterile saline Skin repair:    Repair method:  Tissue adhesive Approximation:    Approximation:  Close Repair type:    Repair type:  Simple Post-procedure details:    Dressing:  Open (no dressing)   Procedure completion:  Tolerated well, no immediate complications    MEDICATIONS ORDERED IN ED: Medications - No data to display   IMPRESSION / MDM / ASSESSMENT AND PLAN / ED COURSE  I reviewed the triage vital signs and the nursing notes.  23 year old female presents for evaluation of laceration to right palm.  Patient was tachycardic on presentation otherwise vital signs are stable.  Patient NAD on exam.  Differential diagnosis includes, but is not limited to, laceration less likely tendon injury, ligament injury, nerve injury and vascular injury.  Patient's presentation is most consistent with acute, uncomplicated illness.  Laceration repaired as described in procedure note above.  Patient was up-to-date on tetanus shot.  Discussed wound care with the patient.  She voiced understanding, all questions answered, she is stable at discharge.      FINAL CLINICAL IMPRESSION(S) / ED DIAGNOSES   Final diagnoses:  Laceration of right hand without foreign body, initial encounter     Rx / DC Orders   ED Discharge Orders     None        Note:  This document was prepared using Dragon voice recognition software and may include unintentional dictation errors.   Cleaster Tinnie LABOR, PA-C 12/29/23 2135    Jossie Artist POUR, MD 12/29/23 2330

## 2023-12-29 NOTE — ED Triage Notes (Signed)
 Pt to ed from home via POV for laceration to right palm about 1/4 inch in length. No bleeding at this time., Pt was cutting vegetables and slipped and cut her hand. Pt is caox4, in no acute distress and ambulatory in triage.

## 2023-12-29 NOTE — Discharge Instructions (Signed)
 The skin glue will begin to fall off in about a week.  Try not to pick at it.  Do not soak your hand in water but you can wash your hands like normal.    Watch for signs of infection including redness, warmth, swelling, pain and pus drainage.  If you develop any of these please return to the ED, urgent care or your primary care provider.

## 2024-01-20 ENCOUNTER — Other Ambulatory Visit: Payer: Self-pay

## 2024-01-20 ENCOUNTER — Emergency Department

## 2024-01-20 ENCOUNTER — Emergency Department
Admission: EM | Admit: 2024-01-20 | Discharge: 2024-01-20 | Disposition: A | Discharge status: Home / Self Care | Attending: Emergency Medicine | Admitting: Emergency Medicine

## 2024-01-20 DIAGNOSIS — O4691 Antepartum hemorrhage, unspecified, first trimester: Secondary | ICD-10-CM | POA: Diagnosis present

## 2024-01-20 DIAGNOSIS — Z3A13 13 weeks gestation of pregnancy: Secondary | ICD-10-CM | POA: Insufficient documentation

## 2024-01-20 DIAGNOSIS — D649 Anemia, unspecified: Secondary | ICD-10-CM | POA: Insufficient documentation

## 2024-01-20 DIAGNOSIS — O468X9 Other antepartum hemorrhage, unspecified trimester: Secondary | ICD-10-CM

## 2024-01-20 LAB — URINALYSIS, W/ REFLEX TO CULTURE (INFECTION SUSPECTED)
Bilirubin Urine: NEGATIVE
Glucose, UA: NEGATIVE mg/dL
Ketones, ur: NEGATIVE mg/dL
Leukocytes,Ua: NEGATIVE
Nitrite: NEGATIVE
Protein, ur: NEGATIVE mg/dL
Specific Gravity, Urine: 1.011 (ref 1.005–1.030)
pH: 7 (ref 5.0–8.0)

## 2024-01-20 LAB — CBC
HCT: 32.9 % — ABNORMAL LOW (ref 36.0–46.0)
Hemoglobin: 11 g/dL — ABNORMAL LOW (ref 12.0–15.0)
MCH: 28.9 pg (ref 26.0–34.0)
MCHC: 33.4 g/dL (ref 30.0–36.0)
MCV: 86.4 fL (ref 80.0–100.0)
Platelets: 225 K/uL (ref 150–400)
RBC: 3.81 MIL/uL — ABNORMAL LOW (ref 3.87–5.11)
RDW: 13.2 % (ref 11.5–15.5)
WBC: 6.8 K/uL (ref 4.0–10.5)
nRBC: 0 % (ref 0.0–0.2)

## 2024-01-20 LAB — BASIC METABOLIC PANEL WITH GFR
Anion gap: 8 (ref 5–15)
BUN: 11 mg/dL (ref 6–20)
CO2: 24 mmol/L (ref 22–32)
Calcium: 9.4 mg/dL (ref 8.9–10.3)
Chloride: 105 mmol/L (ref 98–111)
Creatinine, Ser: 0.4 mg/dL — ABNORMAL LOW (ref 0.44–1.00)
GFR, Estimated: >60 mL/min (ref >60)
Glucose, Bld: 120 mg/dL — ABNORMAL HIGH (ref 70–99)
Potassium: 3.6 mmol/L (ref 3.5–5.1)
Sodium: 137 mmol/L (ref 135–145)

## 2024-01-20 LAB — HCG, QUANTITATIVE, PREGNANCY: hCG, Beta Chain, Quant, S: 111272 m[IU]/mL — ABNORMAL HIGH (ref <5)

## 2024-01-20 NOTE — ED Triage Notes (Signed)
 Pt is coming in for vaginal bleeding after having sex tonight with her significant other. She says she is still currently bleeding and can feel it, she mentions the blood is dark in color and she is having some clots. No previous miscarriages. She does have an OBGYN. She is otherwise stable at this time and just is nervous

## 2024-01-20 NOTE — Discharge Instructions (Signed)
 You may take Tylenol  1000 mg every 6 hours as needed for pain.  Please follow-up with your OB/GYN.  If you begin bleeding so heavily that you are soaking through more than a pad an hour for more than 2 straight hours, feel lightheaded, have chest pain or shortness of breath, have severe abdominal pain, please return to the emergency department.

## 2024-01-20 NOTE — ED Provider Notes (Signed)
 [facility] Provider Note    Event Date/Time   First MD Initiated Contact with Patient 01/20/24 0345     (approximate)   History   Vaginal Bleeding   HPI  Elizabeth Lee is a 23 y.o. female G2, P1 who is approximately 13 weeks 4 days pregnant based on LMP who presents to the emergency department with complaints of vaginal bleeding after intercourse.  Was passing clots but now bleeding has almost completely resolved.  No urinary symptoms, discharge.  Only mild abdominal pain.  OB/GYN is with Carlin Blamer.   History provided by patient, family.    Past Medical History:  Diagnosis Date   Anemia     Past Surgical History:  Procedure Laterality Date   APPENDECTOMY      MEDICATIONS:  Prior to Admission medications   Medication Sig Start Date End Date Taking? Authorizing Provider  acetaminophen  (TYLENOL ) 500 MG tablet Take 2 tablets (1,000 mg total) by mouth every 6 (six) hours as needed (for pain scale < 4  OR  temperature  >/=  100.5 F). 04/28/22   Dickerson, Felicia, CNM  diphenhydrAMINE  (BENADRYL ) 50 MG tablet Take 1 tablet (50 mg total) by mouth at bedtime as needed for itching. 12/16/22   Margrette, Myah A, PA-C  famotidine  (PEPCID ) 20 MG tablet Take 1 tablet (20 mg total) by mouth daily. 06/21/23 06/20/24  Claudene Rover, MD  ferrous sulfate 325 (65 FE) MG tablet Take 325 mg by mouth daily with breakfast.    [provider]  hydrocortisone  cream 1 % Apply 1 Application topically 4 (four) times daily. 12/16/22   Margrette, Myah A, PA-C  ibuprofen  (ADVIL ) 600 MG tablet Take 1 tablet (600 mg total) by mouth every 6 (six) hours as needed. 05/07/22   Myron Nest, CNM  Prenatal Vit-Fe Fumarate-FA (PRENATAL MULTIVITAMIN) TABS tablet Take 1 tablet by mouth daily at 12 noon.    [provider]    Physical Exam   Triage Vital Signs: ED Triage Vitals  Encounter Vitals Group     BP 01/20/24 0137 (!) 127/94     Girls Systolic BP Percentile --       Girls Diastolic BP Percentile --      Boys Systolic BP Percentile --      Boys Diastolic BP Percentile --      Pulse Rate 01/20/24 0137 81     Resp 01/20/24 0137 17     Temp 01/20/24 0137 98.5 F (36.9 C)     Temp Source 01/20/24 0137 Oral     SpO2 01/20/24 0137 100 %     Weight --      Height --      Head Circumference --      Peak Flow --      Pain Score 01/20/24 0138 3     Pain Loc --      Pain Education --      Exclude from Growth Chart --     Most recent vital signs: Vitals:   01/20/24 0137  BP: (!) 127/94  Pulse: 81  Resp: 17  Temp: 98.5 F (36.9 C)  SpO2: 100%    CONSTITUTIONAL: Alert, responds appropriately to questions. Well-appearing; well-nourished HEAD: Normocephalic, atraumatic EYES: Conjunctivae clear, pupils appear equal, sclera nonicteric ENT: normal nose; moist mucous membranes NECK: Supple, normal ROM CARD: RRR; S1 and S2 appreciated RESP: Normal chest excursion without splinting or tachypnea; breath sounds clear and equal bilaterally; no wheezes, no rhonchi, no rales,  no hypoxia or respiratory distress, speaking full sentences ABD/GI: Non-distended; soft, non-tender, no rebound, no guarding, no peritoneal signs BACK: The back appears normal EXT: Normal ROM in all joints; no deformity noted, no edema SKIN: Normal color for age and race; warm; no rash on exposed skin NEURO: Moves all extremities equally, normal speech PSYCH: The patient's mood and manner are appropriate.   ED Results / Procedures / Treatments   LABS: (all labs ordered are listed, but only abnormal results are displayed) Labs Reviewed  CBC - Abnormal; Notable for the following components:      Result Value   RBC 3.81 (*)    Hemoglobin 11.0 (*)    HCT 32.9 (*)    All other components within normal limits  BASIC METABOLIC PANEL WITH GFR - Abnormal; Notable for the following components:   Glucose, Bld 120 (*)    Creatinine, Ser 0.40 (*)    All other components within  normal limits  URINALYSIS, W/ REFLEX TO CULTURE (INFECTION SUSPECTED) - Abnormal; Notable for the following components:   Color, Urine YELLOW (*)    APPearance HAZY (*)    Hgb urine dipstick LARGE (*)    Bacteria, UA RARE (*)    All other components within normal limits  HCG, QUANTITATIVE, PREGNANCY     EKG:  EKG Interpretation Date/Time:    Ventricular Rate:    PR Interval:    QRS Duration:    QT Interval:    QTC Calculation:   R Axis:      Text Interpretation:           RADIOLOGY: My personal review and interpretation of imaging: Small subchorionic hemorrhage.  I have personally reviewed all radiology reports.   US  OB Comp Less 14 Wks Result Date: 01/20/2024 CLINICAL DATA:  Vaginal bleeding. EXAM: OBSTETRIC <14 WK US  AND TRANSVAGINAL OB US  TECHNIQUE: Both transabdominal and transvaginal ultrasound examinations were performed for complete evaluation of the gestation as well as the maternal uterus, adnexal regions, and pelvic cul-de-sac. Transvaginal technique was performed to assess early pregnancy. COMPARISON:  None Available. FINDINGS: Intrauterine gestational sac: Single Yolk sac:  Visualized. Embryo:  Visualized. Cardiac Activity: Visualized. Heart Rate: 153 bpm CRL: 39.0 mm 10 w 6 d US  EDC: August 12, 2024 Subchorionic hemorrhage:  Small (1.7 cm x 0.7 cm x 1.1 cm) Maternal uterus/adnexae: The right ovary measures 3.3 cm x 1.8 cm x 1.8 cm and is normal in appearance. The left ovary measures 3.1 cm x 2.1 cm x 2.9 cm. No pelvic free fluid is seen. IMPRESSION: 1. Single, viable intrauterine pregnancy at approximately 10 weeks and 6 days gestation by ultrasound evaluation. 2. Small subchorionic hemorrhage. Electronically Signed   By: Suzen Dials M.D.   On: 01/20/2024 02:35     PROCEDURES:  Critical Care performed: No    Procedures    IMPRESSION / MDM / ASSESSMENT AND PLAN / ED COURSE  I reviewed the triage vital signs and the nursing notes.    Patient here  with vaginal bleeding after intercourse.  Currently pregnant.   DIFFERENTIAL DIAGNOSIS (includes but not limited to):   Threatened miscarriage, subchorionic hemorrhage, less likely STI, UTI, patient is status post prior appendectomy   Patient's presentation is most consistent with acute presentation with potential threat to life or bodily function.   PLAN: Hemoglobin of 11 which is patient's baseline.  Urine shows red blood cells but no other sign of infection.  This is likely from her vaginal bleeding.  Normal electrolytes.  Ultrasound reviewed and interpreted by myself and the radiologist and shows a single IUP with normal fetal cardiac activity with a small subchorionic hemorrhage which is likely the cause of her bleeding.  Blood type O+.  No indication for RhoGAM.  I feel she is safe for discharge.  Offered Tylenol  for crampy pain which she declines.  Recommended follow-up with her OB/GYN.  Discussed bleeding return precautions.   MEDICATIONS GIVEN IN ED: Medications - No data to display   ED COURSE:  At this time, I do not feel there is any life-threatening condition present. I reviewed all nursing notes, vitals, pertinent previous records.  All lab and urine results, EKGs, imaging ordered have been independently reviewed and interpreted by myself.  I reviewed all available radiology reports from any imaging ordered this visit.  Based on my assessment, I feel the patient is safe to be discharged home without further emergent workup and can continue workup as an outpatient as needed. Discussed all findings, treatment plan as well as usual and customary return precautions.  They verbalize understanding and are comfortable with this plan.  Outpatient follow-up has been provided as needed.  All questions have been answered.    CONSULTS:  none   OUTSIDE RECORDS REVIEWED: Reviewed last delivery notes in November 2023.       FINAL CLINICAL IMPRESSION(S) / ED DIAGNOSES   Final  diagnoses:  Subchorionic hemorrhage of placenta, antepartum     Rx / DC Orders   ED Discharge Orders     None        Note:  This document was prepared using Dragon voice recognition software and may include unintentional dictation errors.   Quanesha Klimaszewski, Josette SAILOR, DO 01/20/24 [phone]

## 2024-03-18 ENCOUNTER — Other Ambulatory Visit: Payer: Self-pay | Admitting: Family Medicine

## 2024-03-18 DIAGNOSIS — Z3482 Encounter for supervision of other normal pregnancy, second trimester: Secondary | ICD-10-CM

## 2024-03-30 ENCOUNTER — Ambulatory Visit
Admission: RE | Admit: 2024-03-30 | Discharge: 2024-03-30 | Disposition: A | Discharge status: Home / Self Care | Source: Ambulatory Visit | Attending: Family Medicine | Admitting: Family Medicine

## 2024-03-30 DIAGNOSIS — Z3689 Encounter for other specified antenatal screening: Secondary | ICD-10-CM | POA: Insufficient documentation

## 2024-03-30 DIAGNOSIS — Z3482 Encounter for supervision of other normal pregnancy, second trimester: Secondary | ICD-10-CM | POA: Insufficient documentation

## 2024-03-30 DIAGNOSIS — Z3492 Encounter for supervision of normal pregnancy, unspecified, second trimester: Secondary | ICD-10-CM | POA: Insufficient documentation

## 2024-03-30 DIAGNOSIS — Z3A20 20 weeks gestation of pregnancy: Secondary | ICD-10-CM | POA: Insufficient documentation

## 2024-03-30 DIAGNOSIS — Z363 Encounter for antenatal screening for malformations: Secondary | ICD-10-CM | POA: Insufficient documentation

## 2024-07-16 ENCOUNTER — Other Ambulatory Visit: Payer: Self-pay | Admitting: Family Medicine

## 2024-07-16 DIAGNOSIS — M79662 Pain in left lower leg: Secondary | ICD-10-CM

## 2024-07-17 ENCOUNTER — Ambulatory Visit
Admission: RE | Admit: 2024-07-17 | Discharge: 2024-07-17 | Disposition: A | Source: Ambulatory Visit | Attending: Family Medicine | Admitting: Family Medicine

## 2024-07-17 DIAGNOSIS — M79662 Pain in left lower leg: Secondary | ICD-10-CM | POA: Diagnosis present
